# Patient Record
Sex: Male | Born: 1990 | Race: White | Hispanic: No | Marital: Single | Smoking: Current every day smoker
Health system: Southern US, Community
[De-identification: ages and names within clinical notes are randomized; demographics above are authoritative.]

## PROBLEM LIST (undated history)

## (undated) DIAGNOSIS — F329 Major depressive disorder, single episode, unspecified: Secondary | ICD-10-CM

## (undated) DIAGNOSIS — F32A Depression, unspecified: Secondary | ICD-10-CM

## (undated) DIAGNOSIS — F419 Anxiety disorder, unspecified: Secondary | ICD-10-CM

## (undated) DIAGNOSIS — I2699 Other pulmonary embolism without acute cor pulmonale: Secondary | ICD-10-CM

## (undated) HISTORY — DX: Other pulmonary embolism without acute cor pulmonale: I26.99

## (undated) HISTORY — PX: KNEE ARTHROSCOPY W/ ACL RECONSTRUCTION: SHX1858

## (undated) HISTORY — PX: OTHER SURGICAL HISTORY: SHX169

---

## 2015-12-19 ENCOUNTER — Ambulatory Visit (INDEPENDENT_AMBULATORY_CARE_PROVIDER_SITE_OTHER): Payer: BLUE CROSS/BLUE SHIELD

## 2015-12-19 ENCOUNTER — Ambulatory Visit (HOSPITAL_COMMUNITY)
Admission: EM | Admit: 2015-12-19 | Discharge: 2015-12-19 | Disposition: A | Discharge status: Home / Self Care | Payer: BLUE CROSS/BLUE SHIELD | Attending: Emergency Medicine | Admitting: Emergency Medicine

## 2015-12-19 ENCOUNTER — Encounter (HOSPITAL_COMMUNITY): Payer: Self-pay | Admitting: Emergency Medicine

## 2015-12-19 DIAGNOSIS — S8991XA Unspecified injury of right lower leg, initial encounter: Secondary | ICD-10-CM

## 2015-12-19 DIAGNOSIS — M25561 Pain in right knee: Secondary | ICD-10-CM

## 2015-12-19 HISTORY — DX: Depression, unspecified: F32.A

## 2015-12-19 HISTORY — DX: Anxiety disorder, unspecified: F41.9

## 2015-12-19 HISTORY — DX: Major depressive disorder, single episode, unspecified: F32.9

## 2015-12-19 MED ORDER — ALBUTEROL SULFATE (2.5 MG/3ML) 0.083% IN NEBU: 2.5000 mg | INHALATION_SOLUTION | Freq: Once | RESPIRATORY_TRACT | Status: DC

## 2015-12-19 MED ORDER — ALBUTEROL SULFATE (2.5 MG/3ML) 0.083% IN NEBU
INHALATION_SOLUTION | RESPIRATORY_TRACT | Status: AC
  Filled 2015-12-19: qty 3

## 2015-12-19 MED ORDER — NAPROXEN 500 MG PO TABS: 500.0000 mg | ORAL_TABLET | Freq: Two times a day (BID) | ORAL | 0 refills | Status: DC

## 2015-12-19 MED ORDER — ALBUTEROL SULFATE HFA 108 (90 BASE) MCG/ACT IN AERS: 2.0000 | INHALATION_SPRAY | RESPIRATORY_TRACT | 0 refills | Status: DC | PRN

## 2015-12-19 NOTE — ED Provider Notes (Signed)
CSN: [id number]     Arrival date & time 12/19/15  1841 History   None    Chief Complaint  Patient presents with  . Knee Pain   (Consider location/radiation/quality/duration/timing/severity/associated sxs/prior Treatment) Patient was playing basketball today and jumped up and when he landed he felt and heard a loud "pop" in his right knee and he felt severe pain and since his knee has been giving out and he has difficulty walking due to instability and pain.   The history is provided by the patient.  Knee Pain  Location:  Knee Time since incident:  6 hours Injury: yes   Mechanism of injury: fall   Fall:    Height of fall:  1 foot   Impact surface:  Athletic surface   Point of impact:  Feet Knee location:  R knee Pain details:    Quality:  Aching   Radiates to:  Does not radiate   Severity:  Severe   Onset quality:  Sudden   Timing:  Constant   Progression:  Unable to specify Chronicity:  New Dislocation: no   Foreign body present:  No foreign bodies Tetanus status:  Unknown Prior injury to area:  Yes Relieved by:  None tried Worsened by:  Nothing Ineffective treatments:  None tried   Past Medical History:  Diagnosis Date  . Anxiety   . Depression    Past Surgical History:  Procedure Laterality Date  . testicular biopsy     History reviewed. No pertinent family history. Social History  Substance Use Topics  . Smoking status: Current Every Day Smoker    Packs/day: 0.50    Years: 5.00    Types: Cigarettes  . Smokeless tobacco: Never Used  . Alcohol use Yes    Review of Systems  Constitutional: Negative.   HENT: Negative.   Eyes: Negative.   Respiratory: Negative.   Cardiovascular: Negative.   Gastrointestinal: Negative.   Endocrine: Negative.   Genitourinary: Negative.   Musculoskeletal: Positive for arthralgias.  Skin: Negative.   Allergic/Immunologic: Negative.   Neurological: Negative.   Hematological: Negative.   Psychiatric/Behavioral:  Negative.     Allergies  Review of patient's allergies indicates no known allergies.  Home Medications   Prior to Admission medications   Medication Sig Start Date End Date Taking? Authorizing Provider  amphetamine-dextroamphetamine (ADDERALL XR) 15 MG 24 hr capsule Take 15 mg by mouth every morning.   Yes Historical Provider, MD  escitalopram (LEXAPRO) 20 MG tablet Take 20 mg by mouth daily.   Yes Historical Provider, MD  propranolol (INDERAL) 10 MG tablet Take 10 mg by mouth 3 (three) times daily.   Yes Historical Provider, MD  traZODone (DESYREL) 50 MG tablet Take 50 mg by mouth at bedtime.   Yes Historical Provider, MD   Meds Ordered and Administered this Visit  Medications - No data to display  BP 132/84 (BP Location: Right Arm)   Pulse 87   Temp 97.8 F (36.6 C) (Oral)   Resp 20   SpO2 99%  No data found.   Physical Exam  Constitutional: He is oriented to person, place, and time. He appears well-developed and well-nourished.  HENT:  Head: Normocephalic and atraumatic.  Eyes: EOM are normal. Pupils are equal, round, and reactive to light.  Neck: Normal range of motion. Neck supple.  Cardiovascular: Normal rate, regular rhythm and normal heart sounds.   Pulmonary/Chest: Effort normal and breath sounds normal.  Musculoskeletal: He exhibits edema and tenderness.  TTP right knee at  patella tendon, left medial knee, decreased rom right knee.  Neurological: He is alert and oriented to person, place, and time.  Nursing note and vitals reviewed.   Urgent Care Course   Clinical Course    Procedures (including critical care time)  Labs Review Labs Reviewed - No data to display  Imaging Review Dg Knee Complete 4 Views Right  Result Date: 12/19/2015 CLINICAL DATA:  Playing basketball today and felt knee pop on place, pain in the right knee EXAM: RIGHT KNEE - COMPLETE 4+ VIEW COMPARISON:  None. FINDINGS: No evidence of fracture, dislocation, or joint effusion. No  evidence of arthropathy or other focal bone abnormality. Soft tissues are unremarkable. IMPRESSION: No acute osseous abnormality. Electronically Signed   By: Jasmine PangKim  Fujinaga M.D.   On: 12/19/2015 19:27     Visual Acuity Review  Right Eye Distance:   Left Eye Distance:   Bilateral Distance:    Right Eye Near:   Left Eye Near:    Bilateral Near:         MDM  Right Knee Pain Right knee injury  Crutches Right Knee imobolizer  Naprosyn 500mg  one po bid x 10 days  School note  Referral to Ortho   Deatra CanterWilliam J Keats Kingry, FNP 12/19/15 2025

## 2015-12-19 NOTE — ED Triage Notes (Signed)
[facility] of right knee pain that started today. The patient stated that he was playing basketball and came down on his right leg and heard his knee "pop." He stated that he put weight on his knee and it "popped out of joint." The patient presented to triage in a wheel chair and had good PMS distal to the knee. The patient had no obvious dislocation at the time of triage. The patient did report a decreased ROM.

## 2016-01-01 ENCOUNTER — Encounter (HOSPITAL_COMMUNITY): Payer: Self-pay | Admitting: Emergency Medicine

## 2016-01-01 ENCOUNTER — Emergency Department (HOSPITAL_COMMUNITY)
Admission: EM | Admit: 2016-01-01 | Discharge: 2016-01-01 | Disposition: A | Discharge status: Home / Self Care | Payer: BLUE CROSS/BLUE SHIELD | Source: Home / Self Care | Attending: Emergency Medicine | Admitting: Emergency Medicine

## 2016-01-01 ENCOUNTER — Emergency Department (HOSPITAL_COMMUNITY)
Admission: EM | Admit: 2016-01-01 | Discharge: 2016-01-01 | Disposition: A | Discharge status: Home / Self Care | Payer: BLUE CROSS/BLUE SHIELD | Attending: Emergency Medicine | Admitting: Emergency Medicine

## 2016-01-01 ENCOUNTER — Encounter (HOSPITAL_COMMUNITY): Payer: Self-pay | Admitting: *Deleted

## 2016-01-01 DIAGNOSIS — M545 Low back pain, unspecified: Secondary | ICD-10-CM

## 2016-01-01 DIAGNOSIS — F17210 Nicotine dependence, cigarettes, uncomplicated: Secondary | ICD-10-CM

## 2016-01-01 DIAGNOSIS — Z79899 Other long term (current) drug therapy: Secondary | ICD-10-CM | POA: Insufficient documentation

## 2016-01-01 DIAGNOSIS — R109 Unspecified abdominal pain: Secondary | ICD-10-CM | POA: Diagnosis present

## 2016-01-01 LAB — URINALYSIS, ROUTINE W REFLEX MICROSCOPIC
BILIRUBIN URINE: NEGATIVE
GLUCOSE, UA: NEGATIVE mg/dL
Hgb urine dipstick: NEGATIVE
Ketones, ur: NEGATIVE mg/dL
Leukocytes, UA: NEGATIVE
NITRITE: NEGATIVE
PH: 6 (ref 5.0–8.0)
Protein, ur: NEGATIVE mg/dL
SPECIFIC GRAVITY, URINE: 1.01 (ref 1.005–1.030)

## 2016-01-01 MED ORDER — KETOROLAC TROMETHAMINE 60 MG/2ML IM SOLN
60.0000 mg | Freq: Once | INTRAMUSCULAR | Status: AC
  Administered 2016-01-01: 60 mg via INTRAMUSCULAR
  Filled 2016-01-01: qty 2

## 2016-01-01 MED ORDER — BUPIVACAINE HCL (PF) 0.5 % IJ SOLN
10.0000 mL | Freq: Once | INTRAMUSCULAR | Status: AC
  Administered 2016-01-01: 10 mL
  Filled 2016-01-01: qty 30

## 2016-01-01 MED ORDER — IBUPROFEN 800 MG PO TABS: 800.0000 mg | ORAL_TABLET | Freq: Four times a day (QID) | ORAL | 0 refills | Status: DC | PRN

## 2016-01-01 MED ORDER — METHOCARBAMOL 500 MG PO TABS
1000.0000 mg | ORAL_TABLET | Freq: Once | ORAL | Status: AC
  Administered 2016-01-01: 1000 mg via ORAL
  Filled 2016-01-01: qty 2

## 2016-01-01 MED ORDER — LIDOCAINE 5 % EX PTCH: 1.0000 | MEDICATED_PATCH | CUTANEOUS | 0 refills | Status: DC

## 2016-01-01 MED ORDER — NAPROXEN 500 MG PO TABS: 500.0000 mg | ORAL_TABLET | Freq: Two times a day (BID) | ORAL | 0 refills | Status: DC

## 2016-01-01 MED ORDER — METHOCARBAMOL 500 MG PO TABS: 500.0000 mg | ORAL_TABLET | Freq: Two times a day (BID) | ORAL | 0 refills | Status: DC

## 2016-01-01 MED ORDER — IBUPROFEN 800 MG PO TABS
800.0000 mg | ORAL_TABLET | Freq: Once | ORAL | Status: AC
  Administered 2016-01-01: 800 mg via ORAL
  Filled 2016-01-01: qty 1

## 2016-01-01 NOTE — ED Provider Notes (Signed)
[facility]-EMERGENCY DEPT Provider Note   CSN: [id number] Arrival date & time: 01/01/16  0023 By signing my name below, I, Matthew Rivers, attest that this documentation has been prepared under the direction and in the presence of Lyndal Pulleyaniel Deyona Soza, MD. Electronically Signed: Linus GalasMaharshi Rivers, ED Scribe. 01/01/16. 1:28 AM.  History   Chief Complaint Chief Complaint  Patient presents with  . Flank Pain   The history is provided by the patient. No language interpreter was used.   HPI Comments: Matthew Rivers is a 25 y.o. male who presents to the Emergency Department with no pertinent PMHx complaining of sudden sharp right sided flank pain that began today. Pt reports his pain is aggravated with deep breathes and lying flat. Pt has not taken anything for pain. Pt denies any other symptoms at this time.   Pt has a torn ACL and is scheduled for surgery next week. Pt is on [place].   Past Medical History:  Diagnosis Date  . Anxiety   . Depression    There are no active problems to display for this patient.  Past Surgical History:  Procedure Laterality Date  . testicular biopsy      Home Medications    Prior to Admission medications   Medication Sig Start Date End Date Taking? Authorizing Provider  amphetamine-dextroamphetamine (ADDERALL XR) 15 MG 24 hr capsule Take 15 mg by mouth every morning.    Historical Provider, MD  escitalopram (LEXAPRO) 20 MG tablet Take 20 mg by mouth daily.    Historical Provider, MD  naproxen (NAPROSYN) 500 MG tablet Take 1 tablet (500 mg total) by mouth 2 (two) times daily with a meal. 12/19/15   Deatra CanterWilliam J Oxford, FNP  propranolol (INDERAL) 10 MG tablet Take 10 mg by mouth 3 (three) times daily.    Historical Provider, MD  traZODone (DESYREL) 50 MG tablet Take 50 mg by mouth at bedtime.    Historical Provider, MD   Family History No family history on file.  Social History Social History  Substance Use Topics  . Smoking status: Current Every Day Smoker   Packs/day: 0.50    Years: 5.00    Types: Cigarettes  . Smokeless tobacco: Never Used  . Alcohol use Yes   Allergies   Patient has no known allergies.  Review of Systems Review of Systems  Constitutional: Negative for chills and fever.  Respiratory: Negative for shortness of breath.   Cardiovascular: Negative for chest pain.  Gastrointestinal: Negative for diarrhea, nausea and vomiting.  Genitourinary: Positive for flank pain. Negative for difficulty urinating and dysuria.  All other systems reviewed and are negative.  Physical Exam Updated Vital Signs BP 117/70 (BP Location: Right Arm)   Pulse 93   Temp 98.1 F (36.7 C) (Oral)   Resp 18   Ht 5\' 9"  (1.753 m)   Wt 200 lb (90.7 kg)   SpO2 100%   BMI 29.53 kg/m   Physical Exam  Constitutional: He is oriented to person, place, and time. He appears well-developed and well-nourished. No distress.  HENT:  Head: Normocephalic and atraumatic.  Nose: Nose normal.  Eyes: Conjunctivae are normal.  Neck: Neck supple. No tracheal deviation present.  Cardiovascular: Normal rate and regular rhythm.   Pulmonary/Chest: Effort normal. No respiratory distress.  Abdominal: Soft. He exhibits no distension and no mass. There is no tenderness. There is no rebound and no guarding.  Musculoskeletal:  High lumbar right paraspinal muscle tenderness  Neurological: He is alert and oriented to person, place, and  time.  Skin: Skin is warm and dry.  Psychiatric: He has a normal mood and affect.   ED Treatments / Results  DIAGNOSTIC STUDIES: Oxygen Saturation is 100% on room air, normal by my interpretation.    COORDINATION OF CARE: 1:28 AM Discussed treatment plan including high dose ibuprofen with pt at bedside and pt agreed to plan.  Labs (all labs ordered are listed, but only abnormal results are displayed) Labs Reviewed  URINALYSIS, ROUTINE W REFLEX MICROSCOPIC (NOT AT [facility])   EKG  EKG Interpretation None      Radiology No results  found.  Procedures Procedures  Emergency Focused Ultrasound Exam Limited Retroperitoneal Ultrasound of Kidneys  Performed and interpreted by Dr. Clydene PughKnott Focused abdominal ultrasound with both kidneys imaged in transverse and longitudinal planes in real-time. Indication: flank pain Findings: bilateral kidneys present, no shadowing, no anechoic areas Interpretation: no hydronephrosis visualized.  no stones or cysts visualized  Images archived electronically  CPT Code: 1610976775   Procedure Note: Trigger Point Injection for Myofascial pain  Performed by Dr. Clydene PughKnott Indication: muscle/myofascial pain Muscle body and tendon sheath of the right lumbar paraspinal muscle(s) were injected with 0.5% bupivacaine under sterile technique for release of muscle spasm/pain. Patient tolerated well with immediate improvement of symptoms and no immediate complications following procedure.  CPT Code:   1 or 2 muscle bodies: 20552   Medications Ordered in ED Medications - No data to display  Initial Impression / Assessment and Plan / ED Course  I have reviewed the triage vital signs and the nursing notes.  Pertinent labs & imaging results that were available during my care of the patient were reviewed by me and considered in my medical decision making (see chart for details).  Clinical Course     25 y.o. male presents with back pain in lumbar area since yesterday without signs of radicular pain. No acute traumatic onset. No red flag symptoms of fever, weight loss, saddle anesthesia, weakness, fecal/urinary incontinence or urinary retention. Offered local trigger point injection for symptomatic treatment. Suspect MSK etiology. No indication for imaging emergently. Patient was recommended to take short course of scheduled NSAIDs and engage in early mobility as definitive treatment. Return precautions discussed for worsening or new concerning symptoms.    Final Clinical Impressions(s) / ED Diagnoses    Final diagnoses:  Acute right-sided low back pain without sciatica    New Prescriptions New Prescriptions   IBUPROFEN (ADVIL,MOTRIN) 800 MG TABLET    Take 1 tablet (800 mg total) by mouth every 6 (six) hours as needed for moderate pain.   I personally performed the services described in this documentation, which was scribed in my presence. The recorded information has been reviewed and is accurate.     Lyndal Pulleyaniel Nekhi Liwanag, MD 01/01/16 [phone]

## 2016-01-01 NOTE — ED Notes (Signed)
I have notified our P.A. Of pt's. Pain score.

## 2016-01-01 NOTE — ED Triage Notes (Addendum)
Patient reports pain to right flack area. Seen yesterday for same. Reports that he was given a steroid shot and ibuprofen with no relief. Pain when moving and laying. Denies urinary symptoms.

## 2016-01-01 NOTE — ED Triage Notes (Signed)
Per EMS, pt from home, pt reports R flank pain since 0900 yesterday.  STates pain is worse with palpation and when taking a deep breath.

## 2016-01-01 NOTE — ED Triage Notes (Signed)
Pt reports pain in his R flank area woke him up yesterday am.  States pain is constant, denies any urinary sxs at this time.  Pt also is worse when taking a deep breath.

## 2016-01-01 NOTE — ED Provider Notes (Signed)
[facility]-EMERGENCY DEPT Provider Note   CSN: [id number] Arrival date & time: 01/01/16  2130     History   Chief Complaint Chief Complaint  Patient presents with  . Back Pain    HPI Matthew Rivers is a 25 y.o. male.  HPI   Matthew Rivers is a 25 y.o. male, with a history of Anxiety and depression, presenting to the ED with right lower back pain beginning yesterday morning. Pain is sharp/stabbing, moderate to severe, radiating toward the right flank. Pt states he was seen in the ED last night and given a shot and some ibuprofen, neither of which helped. Pain is worse when he lays on the right side and with twisting. Denies fever/chills, urinary complaints, changes in bowel or bladder function, neuro deficits, or any other complaints. No history of IV drug use and HIV.     Past Medical History:  Diagnosis Date  . Anxiety   . Depression     There are no active problems to display for this patient.   Past Surgical History:  Procedure Laterality Date  . testicular biopsy         Home Medications    Prior to Admission medications   Medication Sig Start Date End Date Taking? Authorizing Provider  amphetamine-dextroamphetamine (ADDERALL XR) 15 MG 24 hr capsule Take 15 mg by mouth every morning.   Yes Historical Provider, MD  clonazePAM (KLONOPIN) 1 MG tablet Take 1 mg by mouth 2 (two) times daily as needed for anxiety.  12/29/15  Yes Historical Provider, MD  escitalopram (LEXAPRO) 20 MG tablet Take 20 mg by mouth daily.   Yes Historical Provider, MD  HYDROcodone-acetaminophen (NORCO/VICODIN) 5-325 MG tablet Take 1 tablet by mouth every 6 (six) hours as needed for moderate pain.  12/29/15  Yes Historical Provider, MD  propranolol (INDERAL) 10 MG tablet Take 10 mg by mouth daily as needed. For school   Yes Historical Provider, MD  traZODone (DESYREL) 50 MG tablet Take 50 mg by mouth at bedtime as needed for sleep.    Yes Historical Provider, MD  ibuprofen (ADVIL,MOTRIN) 800  MG tablet Take 1 tablet (800 mg total) by mouth every 6 (six) hours as needed for moderate pain. Patient not taking: Reported on 01/01/2016 01/01/16   Lyndal Pulley, MD  lidocaine (LIDODERM) 5 % Place 1 patch onto the skin daily. Remove & Discard patch within 12 hours or as directed by MD 01/01/16   Anselm Pancoast, PA-C  methocarbamol (ROBAXIN) 500 MG tablet Take 1 tablet (500 mg total) by mouth 2 (two) times daily. 01/01/16   Trinidee Schrag C Mikiah Durall, PA-C  naproxen (NAPROSYN) 500 MG tablet Take 1 tablet (500 mg total) by mouth 2 (two) times daily with a meal. Patient not taking: Reported on 01/01/2016 12/19/15   Deatra Canter, FNP  naproxen (NAPROSYN) 500 MG tablet Take 1 tablet (500 mg total) by mouth 2 (two) times daily. 01/01/16   Anselm Pancoast, PA-C    Family History History reviewed. No pertinent family history.  Social History Social History  Substance Use Topics  . Smoking status: Current Every Day Smoker    Packs/day: 0.50    Years: 5.00    Types: Cigarettes  . Smokeless tobacco: Never Used  . Alcohol use Yes     Allergies   Patient has no known allergies.   Review of Systems Review of Systems  Constitutional: Negative for chills and fever.  Respiratory: Negative for cough and shortness of breath.  Cardiovascular: Negative for chest pain.  Gastrointestinal: Negative for abdominal pain, nausea and vomiting.  Genitourinary: Negative for dysuria and hematuria.  Musculoskeletal: Positive for back pain.  Neurological: Negative for weakness and numbness.  All other systems reviewed and are negative.    Physical Exam Updated Vital Signs BP 120/68 (BP Location: Left Arm)   Pulse 98   Temp 98.5 F (36.9 C) (Oral)   Resp 18   SpO2 99%   Physical Exam  Constitutional: He appears well-developed and well-nourished. No distress.  HENT:  Head: Normocephalic and atraumatic.  Eyes: Conjunctivae are normal.  Neck: Neck supple.  Cardiovascular: Normal rate, regular rhythm, normal  heart sounds and intact distal pulses.   Pulmonary/Chest: Effort normal and breath sounds normal. No respiratory distress.  Abdominal: Soft. There is no tenderness. There is no guarding.    Musculoskeletal: He exhibits no edema.       Arms: Tenderness to the right lumbar musculature. Normal motor function intact in all extremities and spine. No midline spinal tenderness.   Lymphadenopathy:    He has no cervical adenopathy.  Neurological: He is alert.  No sensory deficits. Strength 5/5 in all extremities. No gait disturbance. Coordination intact.   Skin: Skin is warm and dry. Capillary refill takes less than 2 seconds. He is not diaphoretic.  Psychiatric: He has a normal mood and affect. His behavior is normal.  Nursing note and vitals reviewed.    ED Treatments / Results  Labs (all labs ordered are listed, but only abnormal results are displayed) Labs Reviewed - No data to display  EKG  EKG Interpretation None       Radiology No results found.  Procedures Procedures (including critical care time)  Medications Ordered in ED Medications  ketorolac (TORADOL) injection 60 mg (60 mg Intramuscular Given 01/01/16 1054)  methocarbamol (ROBAXIN) tablet 1,000 mg (1,000 mg Oral Given 01/01/16 1054)     Initial Impression / Assessment and Plan / ED Course  I have reviewed the triage vital signs and the nursing notes.  Pertinent labs & imaging results that were available during my care of the patient were reviewed by me and considered in my medical decision making (see chart for details).  Clinical Course     Patient presents with right lower back pain beginning yesterday. Patient's presentation and physical exam findings are consistent with muscular source of this patient's pain.  [facility] night/early this morning for this same complaint. He received a trigger point muscular injection to the right lumbar musculature. His kidney was also  visualized under ultrasound at the bedside without signs of renal, hydronephrosis, or any other abnormalities.  During today's visit, patient voiced improvement in his pain with Robaxin and Toradol. The plan of care after discharge was explained to the patient. Patient voiced understanding and was accepting of this plan.  Review of the [place] narc[place] database reveals that the patient received thirty 5-325mg  hydrocodone/APAP on November 9.  Findings and plan of care discussed with Rolland PorterMark James, MD.   Vitals:   01/01/16 0939 01/01/16 1150  BP: 120/68 103/56  Pulse: 98 89  Resp: 18 16  Temp: 98.5 F (36.9 C) 98.6 F (37 C)  TempSrc: Oral   SpO2: 99% 97%      Final Clinical Impressions(s) / ED Diagnoses   Final diagnoses:  Acute right-sided low back pain without sciatica    New Prescriptions Discharge Medication List as of 01/01/2016 11:39 AM  START taking these medications   Details  lidocaine (LIDODERM) 5 % Place 1 patch onto the skin daily. Remove & Discard patch within 12 hours or as directed by MD, Starting Sun 01/01/2016, Print    methocarbamol (ROBAXIN) 500 MG tablet Take 1 tablet (500 mg total) by mouth 2 (two) times daily., Starting Sun 01/01/2016, Print    !! naproxen (NAPROSYN) 500 MG tablet Take 1 tablet (500 mg total) by mouth 2 (two) times daily., Starting Sun 01/01/2016, Print     !! - Potential duplicate medications found. Please discuss with provider.       Anselm PancoastShawn C Brennon Otterness, PA-C 01/01/16 1442    Rolland PorterMark James, MD 01/18/16 1037

## 2016-01-01 NOTE — Discharge Instructions (Signed)
Take it easy, but do not lay around too much as this may make the stiffness worse. Take 500 mg of naproxen every 12 hours or 800 mg of ibuprofen every 8 hours for the next 3 days. Take these medications with food to avoid upset stomach. Robaxin is a muscle relaxer and may help loosen stiff muscles. Do not take the Robaxin while driving or performing other dangerous activities. Be sure to perform the attached exercises starting with three times a week and working up to performing them daily. This is an essential part of preventing long term problems. Follow up with a primary care provider for any future management of these complaints. °

## 2016-11-29 ENCOUNTER — Ambulatory Visit (INDEPENDENT_AMBULATORY_CARE_PROVIDER_SITE_OTHER): Payer: BLUE CROSS/BLUE SHIELD | Admitting: Family Medicine

## 2016-11-29 ENCOUNTER — Telehealth: Payer: Self-pay | Admitting: Family Medicine

## 2016-11-29 ENCOUNTER — Encounter: Payer: Self-pay | Admitting: Family Medicine

## 2016-11-29 VITALS — BP 100/80 | HR 87 | Ht 69.0 in | Wt 228.0 lb

## 2016-11-29 DIAGNOSIS — Z9189 Other specified personal risk factors, not elsewhere classified: Secondary | ICD-10-CM

## 2016-11-29 DIAGNOSIS — F17210 Nicotine dependence, cigarettes, uncomplicated: Secondary | ICD-10-CM | POA: Diagnosis not present

## 2016-11-29 DIAGNOSIS — F909 Attention-deficit hyperactivity disorder, unspecified type: Secondary | ICD-10-CM | POA: Diagnosis not present

## 2016-11-29 DIAGNOSIS — F410 Panic disorder [episodic paroxysmal anxiety] without agoraphobia: Secondary | ICD-10-CM | POA: Insufficient documentation

## 2016-11-29 DIAGNOSIS — F1910 Other psychoactive substance abuse, uncomplicated: Secondary | ICD-10-CM

## 2016-11-29 NOTE — Assessment & Plan Note (Signed)
Given his elevated overdose risk score, and concurrent prescription for Klonopin, Adderall was not refilled today. Psychiatry referral was placed. Per database review patient's last fill on his Adderall was 05/29/2016.

## 2016-11-29 NOTE — Assessment & Plan Note (Addendum)
Had a lengthy discussion with patient regarding his treatment for his anxiety disorder. Given his concurrent prescription for Adderall and significantly elevated overdose risk score, Klonopin was not refilled today. Discussed psychiatry referral with patient, and he agreed. This referral was placed today. Per database review, patient's last fill on his Klonopin was 07/03/2016.  Discussed other treatment options including SSRIs, hydroxyzine, BuSpar, however patient deferred.

## 2016-11-29 NOTE — Progress Notes (Signed)
Subjective:  Matthew Rivers is a 26 y.o. male who presents today with a chief complaint of Anxiety and to establish care.   HPI:  Anxiety, chronic problem, new to this provider Patient reports several year history of anxiety disorder with panic attacks. Reports that he went to the twice a couple of years ago before finally being diagnosed with panic disorder. He has been tried on several medications for this including SSRIs and benzodiazepines. Reports that his previous PCP, Dr. Pernell Dupre at [facility], had been managing him on Klonopin. Reports that Klonopin had worked well for him. Has also tried other medications including BuSpar and hydroxyzine in the past which did not work for him. No current SI or HI. Denies any hospitalizations for psychiatric illness. Reports that he has been off of all medications for about the past month.  ADHD, chronic problem, new to this provider Reports seven to eight year history. Noticed that he was struggling a lot in high school and was started on Adderall. Reports that he had formal testing done at that time. Thinks his Adderall helps significantly. Reports that he has been off of his Adderall for about a month and has noticed worsening of his symptoms. Denies any side effects.  ROS: Per history of present illness, otherwise a 14 point review of systems was performed and was negative  PMH:  The following were reviewed and entered/updated in epic: Past Medical History:  Diagnosis Date  . Anxiety   . Depression   . Pulmonary embolism [facility])    Patient Active Problem List   Diagnosis Date Noted  . Attention deficit hyperactivity disorder (ADHD) 11/29/2016  . Panic disorder without agoraphobia 11/29/2016  . Cigarette nicotine dependence without complication 11/29/2016  . Substance abuse (HCC) 11/29/2016  . History of drug overdose 11/29/2016   Past Surgical History:  Procedure Laterality Date  . KNEE ARTHROSCOPY W/ ACL RECONSTRUCTION    . testicular  biopsy      Family History  Problem Relation Age of Onset  . Anxiety disorder Mother   . Depression Father   . Anxiety disorder Sister   . Anxiety disorder Paternal Grandmother   . Stroke Paternal Grandmother   . Stroke Paternal Grandfather   . Heart attack Paternal Grandfather     Medications- reviewed and updated Current Outpatient Prescriptions  Medication Sig Dispense Refill  . amphetamine-dextroamphetamine (ADDERALL) 15 MG tablet Take 15 mg by mouth 3 (three) times daily.    . clonazePAM (KLONOPIN) 1 MG tablet Take 1 mg by mouth 2 (two) times daily as needed for anxiety.     . propranolol (INDERAL) 10 MG tablet Take 10 mg by mouth as needed.     No current facility-administered medications for this visit.    Allergies-reviewed and updated No Known Allergies  Social History   Social History  . Marital status: Single    Spouse name: N/A  . Number of children: N/A  . Years of education: N/A   Social History Main Topics  . Smoking status: Current Every Day Smoker    Packs/day: 0.50    Years: 5.00    Types: Cigarettes  . Smokeless tobacco: Never Used  . Alcohol use No  . Drug use: No  . Sexual activity: Yes   Other Topics Concern  . None   Social History Narrative  . None   Objective:  Physical Exam: BP 100/80   Pulse 87   Ht  (1.753 m)   Wt 228 lb (103.4  kg)   SpO2 99%   BMI 33.67 kg/m   Gen: NAD, resting comfortably HEENT: TMs clear oropharynx clear CV: RRR with no murmurs appreciated Pulm: NWOB, CTAB with no crackles, wheezes, or rhonchi GI: Normal bowel sounds present. Soft, Nontender, Nondistended. MSK: No edema, cyanosis, or clubbing noted Skin: Warm, dry Neuro: Grossly normal, moves all extremities Psych: Normal affect and thought content  Assessment/Plan:  Panic disorder without agoraphobia Had a lengthy discussion with patient regarding his treatment for his anxiety disorder. Given his concurrent prescription for Adderall and  significantly elevated overdose risk score, Klonopin was not refilled today. Discussed psychiatry referral with patient, and he agreed. This referral was placed today. Per database review, patient's last fill on his Klonopin was 07/03/2016.  Discussed other treatment options including SSRIs, hydroxyzine, BuSpar, however patient deferred.  Attention deficit hyperactivity disorder (ADHD) Given his elevated overdose risk score, and concurrent prescription for Klonopin, Adderall was not refilled today. Psychiatry referral was placed. Per database review patient's last fill on his Adderall was 05/29/2016.  Substance abuse (HCC) Reports that he is no longer taking Suboxone. Given his history of substance abuse, would avoid prescriptions of controlled substances, or other medications at high risk for misuse and/or addiction.  History of drug overdose Patient denies such history today, however upon chart review noted that patient was hospitalized in May 2018 with suicide attempt and intentional overdose. Denies any suicidality today. Again, would avoid prescription of controlled substances or other medications at high risk for misuse.  Cigarette nicotine dependence without complication Patient was asked about his tobacco use today and was strongly advised to quit. Patient is currently contemplative. We reviewed treatment options to assist him quit smoking including NRT and Bupropion. Would avoid Chantix given his history of suicidality. Patient is not interested in any pharmacotherapy at this point. Follow-up at next office visit.  Total time spent counseling approximately 3 minutes.   Katina Degree. Jimmey Ralph, MD 11/29/2016 2:37 PM

## 2016-11-29 NOTE — Telephone Encounter (Signed)
Patient called in reference to referral. Patient stated referral did not have anything until mid January. Patient would like to know if there is somewhere else he can get referred to. Please call patient and advise. OK to leave message.

## 2016-11-29 NOTE — Patient Instructions (Signed)
I placed a referral to psychiatry today.  I also sent in a refill for your propranolol.  Let me know if you change your mind about the flu shot or starting other medications for your anxiety.  Take care,  Dr Jimmey Ralph

## 2016-11-29 NOTE — Assessment & Plan Note (Signed)
Patient denies such history today, however upon chart review noted that patient was hospitalized in May 2018 with suicide attempt and intentional overdose. Denies any suicidality today. Again, would avoid prescription of controlled substances or other medications at high risk for misuse.

## 2016-11-29 NOTE — Assessment & Plan Note (Signed)
Patient was asked about his tobacco use today and was strongly advised to quit. Patient is currently contemplative. We reviewed treatment options to assist him quit smoking including NRT and Bupropion. Would avoid Chantix given his history of suicidality. Patient is not interested in any pharmacotherapy at this point. Follow-up at next office visit.  Total time spent counseling approximately 3 minutes.

## 2016-11-29 NOTE — Telephone Encounter (Signed)
Mellody Dance, Can you look into trying to get this patient into a facility ASAP?

## 2016-11-29 NOTE — Assessment & Plan Note (Signed)
Reports that he is no longer taking Suboxone. Given his history of substance abuse, would avoid prescriptions of controlled substances, or other medications at high risk for misuse and/or addiction.

## 2016-11-30 ENCOUNTER — Telehealth: Payer: Self-pay | Admitting: Family Medicine

## 2016-11-30 ENCOUNTER — Ambulatory Visit: Payer: BLUE CROSS/BLUE SHIELD | Admitting: Family Medicine

## 2016-11-30 NOTE — Telephone Encounter (Signed)
Note is up front for pick up. Pt is aware.

## 2016-11-30 NOTE — Telephone Encounter (Signed)
Patient called in reference to needing a note for school from his visit on 11/29/16. Please call patient and advise when ready for pick up.

## 2017-01-29 ENCOUNTER — Ambulatory Visit (HOSPITAL_COMMUNITY): Payer: BLUE CROSS/BLUE SHIELD | Admitting: Psychiatry

## 2017-10-21 IMAGING — DX DG KNEE COMPLETE 4+V*R*
4 series · 4 of 4 positions shown · non-contrast
Comparison: None.

CLINICAL DATA: Playing basketball today and felt knee pop on place,
pain in the right knee

EXAM:
RIGHT KNEE - COMPLETE 4+ VIEW

[knee ap]
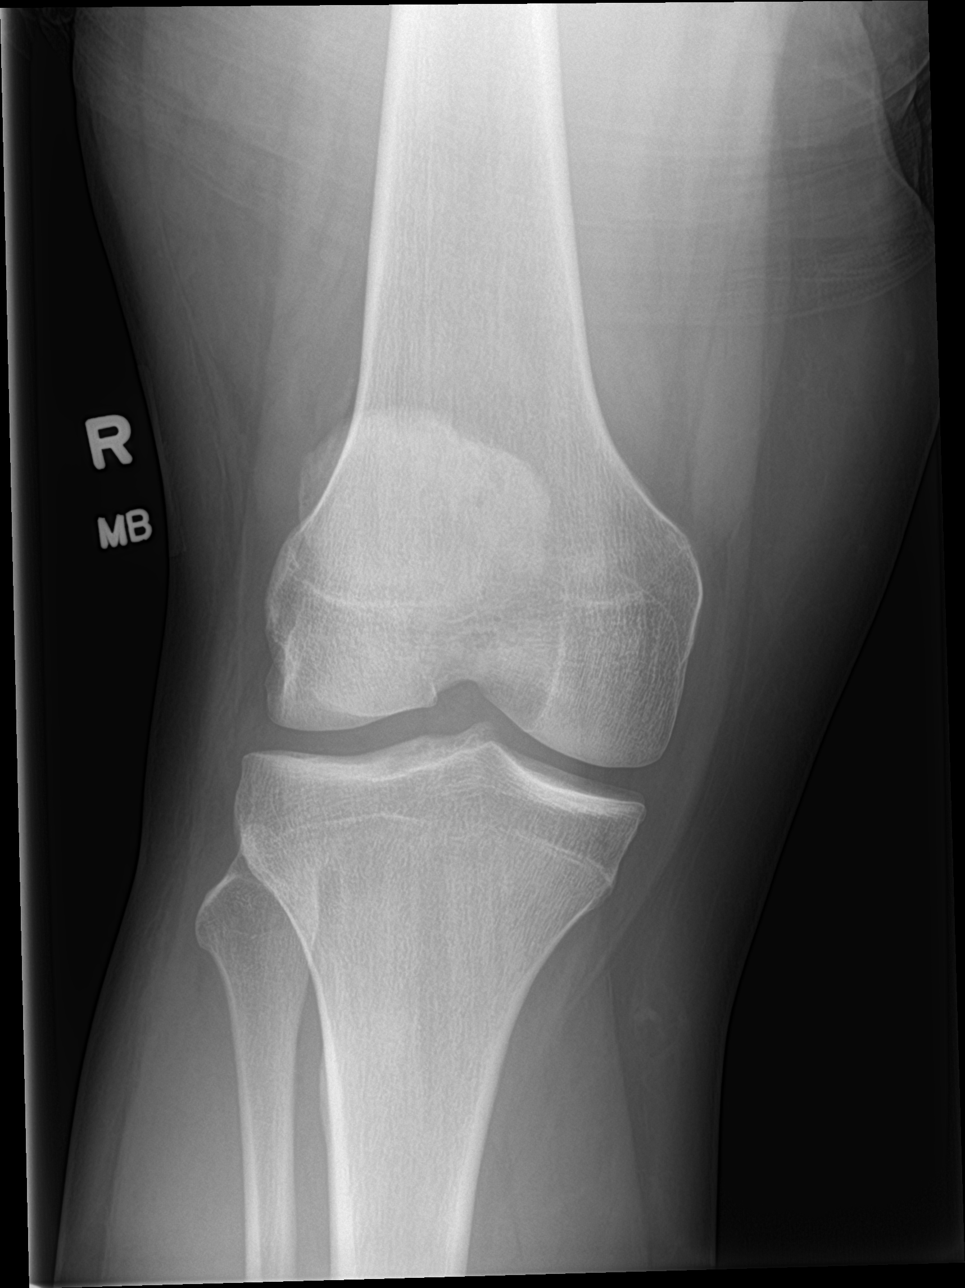

[knee obl (1 of 2)]
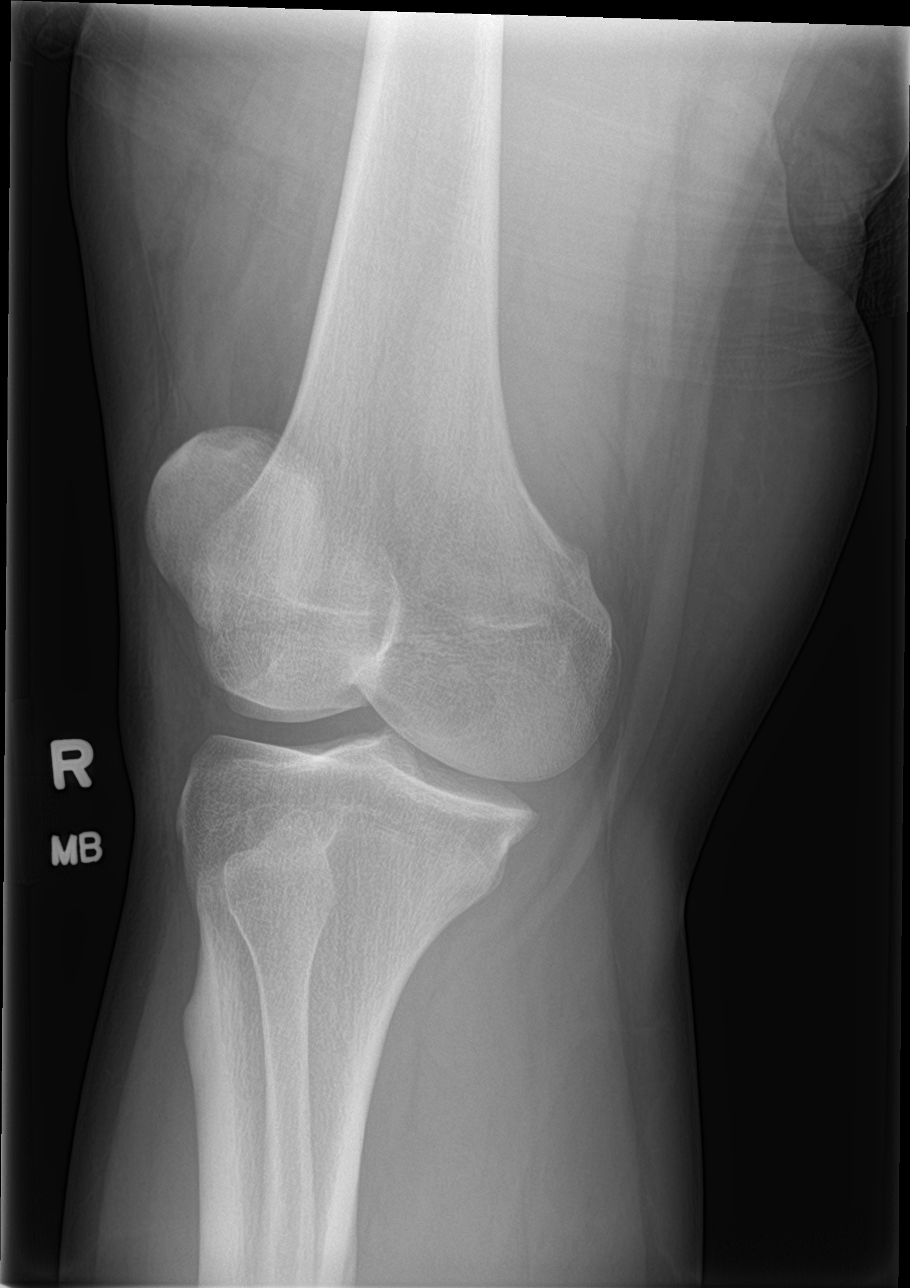

[knee obl (2 of 2)]
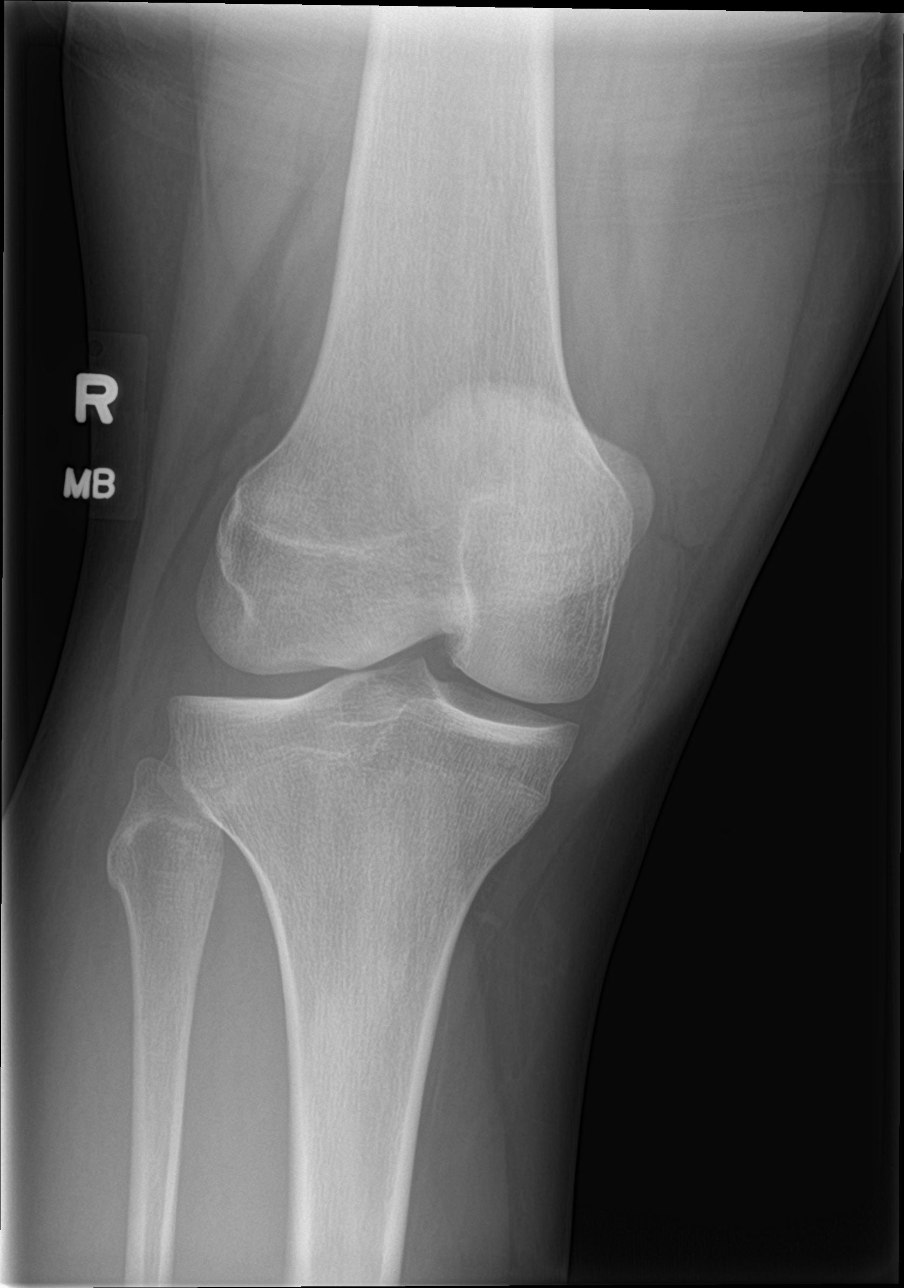

[knee lat]
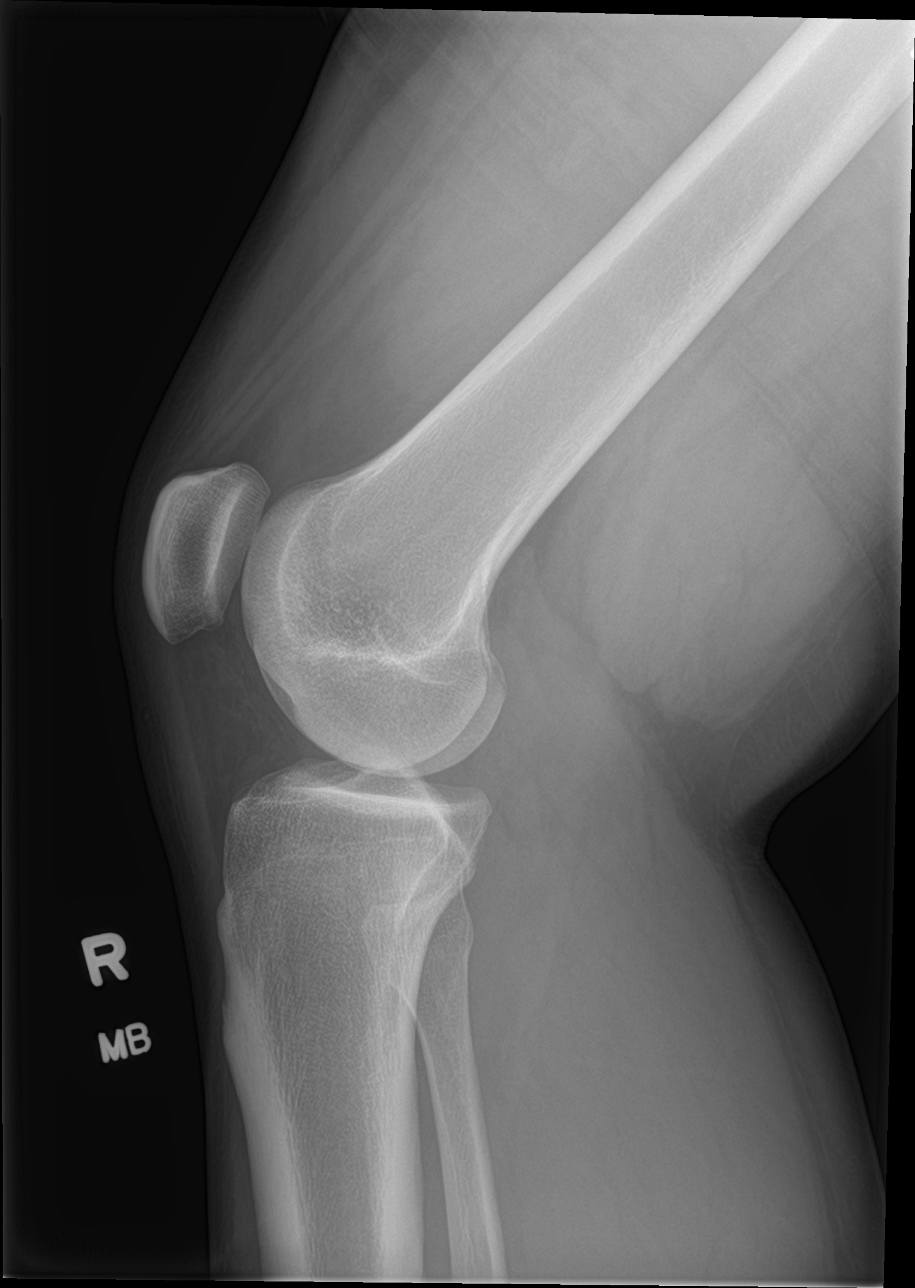

[4 of 4 positions shown; findings below may reference images not displayed]

FINDINGS: No evidence of fracture, dislocation, or joint effusion. No evidence
of arthropathy or other focal bone abnormality. Soft tissues are
unremarkable.
IMPRESSION: No acute osseous abnormality.
